# Patient Record
Sex: Male | Born: 1990 | Race: White | Hispanic: No | Marital: Single | Smoking: Current some day smoker
Health system: Southern US, Community
[De-identification: ages and names within clinical notes are randomized; demographics above are authoritative.]

---

## 2014-06-11 ENCOUNTER — Encounter (HOSPITAL_COMMUNITY): Payer: Self-pay | Admitting: Physical Medicine and Rehabilitation

## 2014-06-11 ENCOUNTER — Emergency Department (HOSPITAL_COMMUNITY)
Admission: EM | Admit: 2014-06-11 | Discharge: 2014-06-12 | Disposition: A | Discharge status: Other Acute Inpatient Hospital | Payer: Self-pay | Attending: Emergency Medicine | Admitting: Emergency Medicine

## 2014-06-11 DIAGNOSIS — Z720 Tobacco use: Secondary | ICD-10-CM | POA: Insufficient documentation

## 2014-06-11 DIAGNOSIS — F419 Anxiety disorder, unspecified: Secondary | ICD-10-CM | POA: Insufficient documentation

## 2014-06-11 DIAGNOSIS — F319 Bipolar disorder, unspecified: Secondary | ICD-10-CM | POA: Insufficient documentation

## 2014-06-11 DIAGNOSIS — G479 Sleep disorder, unspecified: Secondary | ICD-10-CM | POA: Insufficient documentation

## 2014-06-11 LAB — CBC
HCT: 46 % (ref 39.0–52.0)
Hemoglobin: 15.9 g/dL (ref 13.0–17.0)
MCH: 29.8 pg (ref 26.0–34.0)
MCHC: 34.6 g/dL (ref 30.0–36.0)
MCV: 86.3 fL (ref 78.0–100.0)
Platelets: 252 10*3/uL (ref 150–400)
RBC: 5.33 MIL/uL (ref 4.22–5.81)
RDW: 12.6 % (ref 11.5–15.5)
WBC: 8.7 10*3/uL (ref 4.0–10.5)

## 2014-06-11 LAB — RAPID URINE DRUG SCREEN, HOSP PERFORMED
Amphetamines: NOT DETECTED
Barbiturates: NOT DETECTED
Benzodiazepines: NOT DETECTED
Cocaine: NOT DETECTED
OPIATES: NOT DETECTED
Tetrahydrocannabinol: POSITIVE — AB

## 2014-06-11 LAB — COMPREHENSIVE METABOLIC PANEL
ALT: 20 U/L (ref 0–53)
ANION GAP: 13 (ref 5–15)
AST: 32 U/L (ref 0–37)
Albumin: 4.8 g/dL (ref 3.5–5.2)
Alkaline Phosphatase: 36 U/L — ABNORMAL LOW (ref 39–117)
BILIRUBIN TOTAL: 1 mg/dL (ref 0.3–1.2)
BUN: 14 mg/dL (ref 6–23)
CO2: 25 mmol/L (ref 19–32)
CREATININE: 1.13 mg/dL (ref 0.50–1.35)
Calcium: 9.2 mg/dL (ref 8.4–10.5)
Chloride: 101 mmol/L (ref 96–112)
GFR calc Af Amer: >90 mL/min (ref >90)
GFR calc non Af Amer: >90 mL/min (ref >90)
Glucose, Bld: 76 mg/dL (ref 70–99)
Potassium: 4 mmol/L (ref 3.5–5.1)
Sodium: 139 mmol/L (ref 135–145)
TOTAL PROTEIN: 7.6 g/dL (ref 6.0–8.3)

## 2014-06-11 LAB — SALICYLATE LEVEL: Salicylate Lvl: <4 mg/dL (ref 2.8–20.0)

## 2014-06-11 LAB — ETHANOL: Alcohol, Ethyl (B): <5 mg/dL (ref 0–9)

## 2014-06-11 LAB — ACETAMINOPHEN LEVEL

## 2014-06-11 MED ORDER — ALUM & MAG HYDROXIDE-SIMETH 200-200-20 MG/5ML PO SUSP: 30.0000 mL | ORAL | Status: DC | PRN

## 2014-06-11 MED ORDER — LORAZEPAM 1 MG PO TABS: 1.0000 mg | ORAL_TABLET | Freq: Three times a day (TID) | ORAL | Status: DC | PRN

## 2014-06-11 MED ORDER — ACETAMINOPHEN 325 MG PO TABS: 650.0000 mg | ORAL_TABLET | ORAL | Status: DC | PRN

## 2014-06-11 NOTE — ED Notes (Signed)
[employer] HERE TO SERVE PAPERS

## 2014-06-11 NOTE — ED Notes (Signed)
FAXED IVC PAPERS TO [facility] 

## 2014-06-11 NOTE — Progress Notes (Addendum)
Patient accepted at [facility], per [employer]. Per RN Drinda ButtsAnnette, patient has been waiting for the Voluntary Consent papers from [place] [place].  Kelvin at [facility] stated that he is now faxing the voluntary consent to [facility] to [employer].  CSW to continue to follow up upon patient's admission to [facility].  Mark [name], LCSWA Disposition staff 06/11/2014 4:06 PM

## 2014-06-11 NOTE — ED Provider Notes (Signed)
CSN: [id number]     Arrival date & time 06/11/14  1018 History   First MD Initiated Contact with Patient 06/11/14 1053     Chief Complaint  Patient presents with  . Psychiatric Evaluation   Mark [name] is a 24 y.o. male with a history of bipolar disorder who presents to the ED with his father who reports he has not been acting himself over the past 4 days. The father believes that he might be having a manic episode. The patient reports he has had lots of thoughts going on in his head. He reports he is a trouble sleeping the past 4 days. He reports he had not slept for 4 days but then slept some last night. He also reports lots of sexual thoughts going thorough his head. He denies suicidal or homicidal ideations. Patient reports he was diagnosed bipolar approximately 1 year ago and was placed on lithium. He reports he has not taken any medications in almost a year however. Patient admits to using marijuana recently but no other illicit substances. The patient endorses thought broadcasting. Patient denies fevers, pain, abdominal pain, headaches, nausea, vomiting, cocaine use, stimulant abuse, alcohol use, SI or HI. He denies visual or auditory hallucinations.  (Consider location/radiation/quality/duration/timing/severity/associated sxs/prior Treatment) HPI  History reviewed. No pertinent past medical history. History reviewed. No pertinent past surgical history. No family history on file. History  Substance Use Topics  . Smoking status: Current Some Day Smoker  . Smokeless tobacco: Not on file  . Alcohol Use: Yes    Review of Systems  Constitutional: Negative for fever and chills.  HENT: Negative for congestion and sore throat.   Eyes: Negative for visual disturbance.  Respiratory: Negative for cough and shortness of breath.   Cardiovascular: Negative for chest pain and palpitations.  Gastrointestinal: Negative for nausea, vomiting and abdominal pain.  Genitourinary: Negative for  dysuria.  Musculoskeletal: Negative for back pain and neck pain.  Skin: Negative for rash.  Neurological: Negative for syncope, light-headedness and headaches.  Psychiatric/Behavioral: Positive for sleep disturbance. Negative for suicidal ideas, hallucinations and self-injury. The patient is nervous/anxious.       Allergies  Review of patient's allergies indicates no known allergies.  Home Medications   Prior to Admission medications   Not on File   BP 133/72 mmHg  Pulse 84  Temp(Src) 98.8 F (37.1 C) (Oral)  Resp 16  SpO2 100% Physical Exam  Constitutional: He is oriented to person, place, and time. He appears well-developed and well-nourished. No distress.  HENT:  Head: Normocephalic and atraumatic.  Mouth/Throat: Oropharynx is clear and moist.  Eyes: Conjunctivae are normal. Pupils are equal, round, and reactive to light. Right eye exhibits no discharge. Left eye exhibits no discharge.  Neck: Neck supple. No JVD present.  Cardiovascular: Normal rate, regular rhythm, normal heart sounds and intact distal pulses.  Exam reveals no gallop and no friction rub.   No murmur heard. Pulmonary/Chest: Effort normal and breath sounds normal. No respiratory distress. He has no wheezes. He has no rales.  Abdominal: Soft. He exhibits no distension. There is no tenderness.  Musculoskeletal: He exhibits no edema.  Lymphadenopathy:    He has no cervical adenopathy.  Neurological: He is alert and oriented to person, place, and time. Coordination normal.  Skin: Skin is warm and dry. No rash noted. He is not diaphoretic. No erythema. No pallor.  Psychiatric: His mood appears anxious. His affect is blunt. His affect is not angry and not labile. His speech is  delayed and tangential. His speech is not rapid and/or pressured and not slurred. He is not hyperactive and not combative. Thought content is not paranoid. He expresses no homicidal and no suicidal ideation.  Patient appears anxious. He is  alert and oriented x3. He denies SI or HI. He is calm and cooperative, but sometimes has difficulty responding to simple questions. He endorses thought broadcasting. He reports racing thoughts and sexual thoughts.  He is inattentive.  Nursing note and vitals reviewed.   ED Course  Procedures (including critical care time) Labs Review Labs Reviewed  COMPREHENSIVE METABOLIC PANEL - Abnormal; Notable for the following:    Alkaline Phosphatase 36 (*)    All other components within normal limits  URINE RAPID DRUG SCREEN (HOSP PERFORMED) - Abnormal; Notable for the following:    Tetrahydrocannabinol POSITIVE (*)    All other components within normal limits  ACETAMINOPHEN LEVEL - Abnormal; Notable for the following:    Acetaminophen (Tylenol), Serum <10.0 (*)    All other components within normal limits  CBC  ETHANOL  SALICYLATE LEVEL    Imaging Review No results found.   EKG Interpretation None      Filed Vitals:   06/11/14 1031 06/11/14 1240  BP: 130/95 133/72  Pulse: 109 84  Temp: 98.8 F (37.1 C)   TempSrc: Oral   Resp: 18 16  SpO2: 96% 100%     MDM   Final diagnoses:  Bipolar affective disorder, most recent episode unspecified type, remission status unspecified   This is a 24 y.o. male with a history of bipolar disorder who presents to the ED with his father who reports he has not been acting himself over the past 4 days. The father believes that he might be having a manic episode. The patient reports he has had lots of thoughts going on in his head. He reports he is a trouble sleeping the past 4 days. He reports he had not slept for 4 days but then slept some last night. He also reports lots of sexual thoughts going thorough his head. He denies suicidal or homicidal ideations. He denies physical complaints currently. He denies use of illicit substances other than marijuana recently. The patient is afebrile and nontoxic appearing on exam. His behavior is bizarre and his  reports trouble with sleeping. He has a history of bipolar disorder. His father is at bedside. The patient is calm and cooperative with interview. His urine rapid drug screen is positive only for THC. His alcohol level is negative. He has a negative acetaminophen and salicylate level. His CBC and CMP are unremarkable. Patient is medically clear for behavioral health placement. Behavioral crisis that he meets inpatient criteria. The patient is [facility]. Will disposition accordingly when bed becomes available.     Everlene FarrierWilliam Brennah Quraishi, PA-C 06/11/14 1626  Jerelyn ScottMartha Linker, MD 06/11/14 [phone]

## 2014-06-11 NOTE — ED Notes (Signed)
Called an left message again to sheriff for transport

## 2014-06-11 NOTE — BH Assessment (Signed)
Writer received consult.  The nurse reports that he will place the Tele Consult machine in the room.

## 2014-06-11 NOTE — Progress Notes (Addendum)
Per Claris CheMargaret at [facility], patient accepted [facility] by Dr. Ardyth HarpsHernandez, to bed 315. Call report at [phone]. Patient can come after 9pm. MC-ED nurse aware.  Melbourne Abtsatia Dulcinea Kinser, LCSWA Disposition staff 06/11/2014 9:14 PM

## 2014-06-11 NOTE — ED Notes (Signed)
Pt presents to department for evaluation of confusion and agitation. Father reports patient isn't acting like himself. States he was once treated for manic episode several years ago. Pt denies pain. He is calm and cooperative at the time.

## 2014-06-11 NOTE — ED Notes (Signed)
Telepsych in process at this time.
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
## 2014-06-11 NOTE — ED Notes (Signed)
Called sheriff and left message for transport after 9pm

## 2014-06-11 NOTE — ED Notes (Signed)
FAXED [facility] TO [facility]

## 2014-06-11 NOTE — Progress Notes (Signed)
This writer received call from Kelvin ([phone]) at [facility].  Kelvin requesting Dr's and the counselor's note, and the labs to be faxed at [phone]. Kelvin was informed that pt's referral was faxed at this time.  Catia Magnolia Mattila, LCSWA Disposition staff 06/11/2014 7:26 PM  

## 2014-06-11 NOTE — BH Assessment (Signed)
Per Brett Albinoori, NP - patient meets criteria for inpatient hospitalization. [facility].   CSW will refer to other facilities.

## 2014-06-12 ENCOUNTER — Inpatient Hospital Stay
Admit: 2014-06-12 | Disposition: A | Discharge status: Home / Self Care | Payer: Self-pay | Attending: Psychiatry | Admitting: Psychiatry

## 2014-06-12 MED ORDER — DIPHENHYDRAMINE HCL 25 MG PO CAPS
25.0000 mg | ORAL_CAPSULE | Freq: Every evening | ORAL | Status: DC | PRN
  Administered 2014-06-12: 25 mg via ORAL
  Filled 2014-06-12: qty 1

## 2014-06-12 NOTE — ED Provider Notes (Signed)
10 AM  [facility] per Dr. Ardyth HarpsHernandez.  Mark MawKristen N Tito Ausmus, DO 06/12/14 1001

## 2014-06-12 NOTE — ED Notes (Signed)
SHERRIFF HAS CALLED BACK AND ADVISES THAT THE TRANSPORT PERSON IS AWARE OF THE PATIENT AND WILL CALL BEFORE ARRIVAL

## 2014-06-12 NOTE — ED Notes (Signed)
CALLED CALVIN AT [facility] AND UPDATED HIM ON TRANSPORT DELAY

## 2014-06-12 NOTE — ED Notes (Signed)
CALLED SHERRIFF AND LEFT MESSAGE ABOUT PT TRANSPORT TO [facility]. STILL NO CALL BACK FROM PREVIOUS MESSAGES LEFT

## 2014-06-12 NOTE — ED Notes (Signed)
sherriff has arrived to transport

## 2014-06-12 NOTE — ED Notes (Signed)
Called sherriff about transport and left message with phone number

## 2014-06-12 NOTE — ED Notes (Signed)
Pt asked for benadryl to help sleep, pt very suspicious and appears to be responding to auditory hallucinations, pt questioned "what chemical balance benadryl was made of and how many neurons. "

## 2014-06-12 NOTE — ED Notes (Signed)
PT FATHER HAS CALLED TO CHECK ON HIM. HE HAS ASKED THAT WE TELL HIM HE WILL SEE HIM AT [facility] AROUND 4 PM. ASKED FATHER TO BRING PT SOME CLOTHES TO WEAR WHEN HE GOES TO VISIT

## 2014-06-13 LAB — LIPID PANEL
Cholesterol: 175 mg/dL
HDL Cholesterol: 44 mg/dL
Ldl Cholesterol, Calc: 120 mg/dL — ABNORMAL HIGH
Triglycerides: 56 mg/dL
VLDL CHOLESTEROL, CALC: 11 mg/dL

## 2014-06-13 LAB — CBC WITH DIFFERENTIAL/PLATELET
BASOS PCT: 0.4 %
Basophil #: 0 10*3/uL (ref 0.0–0.1)
EOS ABS: 0 10*3/uL (ref 0.0–0.7)
Eosinophil %: 0.4 %
HCT: 46.3 % (ref 40.0–52.0)
HGB: 15.5 g/dL (ref 13.0–18.0)
LYMPHS PCT: 22.6 %
Lymphocyte #: 2 10*3/uL (ref 1.0–3.6)
MCH: 29.9 pg (ref 26.0–34.0)
MCHC: 33.6 g/dL (ref 32.0–36.0)
MCV: 89 fL (ref 80–100)
Monocyte #: 0.7 x10 3/mm (ref 0.2–1.0)
Monocyte %: 7.3 %
NEUTROS ABS: 6.2 10*3/uL (ref 1.4–6.5)
Neutrophil %: 69.3 %
Platelet: 278 10*3/uL (ref 150–440)
RBC: 5.21 10*6/uL (ref 4.40–5.90)
RDW: 13.4 % (ref 11.5–14.5)
WBC: 9 10*3/uL (ref 3.8–10.6)

## 2014-06-13 LAB — TSH: THYROID STIMULATING HORM: 0.895 u[IU]/mL

## 2014-06-13 LAB — COMPREHENSIVE METABOLIC PANEL
AST: 27 U/L
Albumin: 5.3 g/dL — ABNORMAL HIGH
Alkaline Phosphatase: 35 U/L — ABNORMAL LOW
Anion Gap: 8 (ref 7–16)
BUN: 15 mg/dL
Bilirubin,Total: 1.3 mg/dL — ABNORMAL HIGH
CALCIUM: 9.9 mg/dL
Chloride: 103 mmol/L
Co2: 29 mmol/L
Creatinine: 0.95 mg/dL
EGFR (Non-African Amer.): >60
Glucose: 82 mg/dL
POTASSIUM: 4 mmol/L
SGPT (ALT): 21 U/L
Sodium: 140 mmol/L
TOTAL PROTEIN: 8.4 g/dL — AB

## 2014-06-13 LAB — HEMOGLOBIN A1C: HEMOGLOBIN A1C: 5.2 %

## 2014-06-20 NOTE — H&P (Signed)
PATIENT NAME:  Mark [name], Mark [name] MR#:  [id number] DATE OF BIRTH:  November 14, 1990  DATE OF ADMISSION:  06/12/2014  IDENTIFYING DATA: The patient is [name] 24 year old male who was IVCd from [facility] in [place].   CHIEF COMPLAINT: The patient relates that his father brought him into the hospital. When asked why, he states "addiction, love, God."   HISTORY OF PRESENT ILLNESS: The patient is not [name] reliable historian. However, the facts that he presents are that he was brought in for "bad habits." When asked to specify, he stated he has been using [name] lot of marijuana. He states he has been using about [name] gram [name] day for the past month. He states that his mood has been "stable." Then he stated that his mood has been "up." He states his sleep has not been good and he has not been sleeping more than 1 to 2 hours [name] day. He denies signs and symptoms of depression. He states that the marijuana has been helping him. He does not really answer questions on topic; however, available collateral information indicates that he was brought in to the emergency room in [place] by his father. His father brought him in for bizarre behavior. The patient informed people in [place] that he will make [name] lot of money and be famous. He alluded to being fixated on [employer] making him famous. He denies any suicidal ideation or homicidal ideation.   PAST PSYCHIATRIC HISTORY: The patient states that he did take lithium for about 3 weeks. He states that he believes he last took this about [name] year ago. He stated it gave him problems with feeling bloated and having regular bowel movements. He denies any other past psychiatric trials. He states that he was hospitalized in what he believed was called [facility] for bipolar. Per collateral information, the patient was hospitalized in [place] for 3 weeks in 2015. He was diagnosed with bipolar disorder and prescribed the lithium as noted above. He denies any past  suicide attempts. When asked about other drugs besides marijuana, he did allude to opiates (Opana, OxyContin, Lortab). He states that he was last using Opana about 1 month ago. He states he also has used Xanax and Adderall.   PAST MEDICAL HISTORY: When asked about any past medical history, the patient did not endorse any past medical problems, but then again was reluctant to answer the question directly.   FAMILY HISTORY OF PSYCHIATRIC ILLNESS: The patient states his father has depression.   SOCIAL HISTORY: The patient was evasive and guarded around these questions. All he would state when I asked where he was living, as he was states he was, "living with God."   REVIEW OF SYSTEMS: The patient denied any current symptoms, however, his answers were off topic. He states that he did have chest pain when he stopped smoking weed. When asked about fever, night sweats or weight loss, he simply stared. When asked about diarrhea or constipation, he simply stared. When asked about pain in any area, he stared at me. His response to 10 review of systems was simply to stare at me.   MENTAL STATUS EXAMINATION: The patient was [name] young male appearing his stated age of 24. He was alert to place and his situation. He was alert to person. Otherwise, not alert to date or specific location. He was guarded. His speech was halted and it was indicative of impoverish thoughts. His thought process was disorganized. At times, it looked like there was thought  blocking. Thought content: He denied any suicidal ideation, denied any homicidal ideation. He denied any auditory hallucinations or visual hallucinations, but it was evident  he was having some auditory hallucinations and potentially visual hallucinations as he would sometimes stare off and utter something that was not in the context of the questions I was asking him. His mood was "up." His affect was bizarre. At times, he would smile and simply stare and at other times, his  affect would become constricted. His insight and judgment are poor. His remote memory did appear to be intact as he was able to discuss past history, his trials of lithium and how he got to the hospital. However, his memory for details and things appear to be affected by his disorganized thought process.   PHYSICAL EXAMINATION: The patient has refused vital signs. His gait was grossly intact. His muscle strength was grossly intact. Given his psychotic state, we are going to defer physical examination for now.   LABORATORY RESULTS: The patient is admitted to this hospital, however, had some labs done at [facility]. The results are not printed out in full; however, they relate that the comprehensive metabolic panel is only notable for elevated alkaline phosphatase. The urine drug screen was positive for marijuana and Tylenol was level was noted to be less than 10. I am not able to see the results of the CBC. Thus, I am going to order laboratory studies.   ASSESSMENT AND PLAN: [name] 24 year old male with [name] past diagnosis of bipolar disorder. Per history and collateral information, it appears he has had mania, but also in the context per his report of heavy cannabis use. He has not been on any medications and this appears that this has been the case for several months if not [name] year. We will try to get him to agree to take some Zyprexa Zydis. We will follow him. I am going to go ahead and order laboratory studies as his information from [facility] indicates some labs were done, but all I see is [name] comprehensive metabolic panel and [name] urine drug screen.     ____________________________ Loralie Champagne Mayford Knife, MD alw:TT D: 06/12/2014 13:50:41 ET T: 06/12/2014 15:01:41 ET JOB#: [id number]  cc: Tamma Brigandi L. Mayford Knife, MD, <Dictator> Kerin Salen MD ELECTRONICALLY SIGNED 06/16/2014 13:03

## 2016-05-11 IMAGING — CR DG CHEST 1V
1 series · 1 of 1 positions shown · non-contrast
Comparison: None.

CLINICAL DATA: TB clearance

EXAM:
CHEST  1 VIEW

[dxr chest 1 viewap or pa]
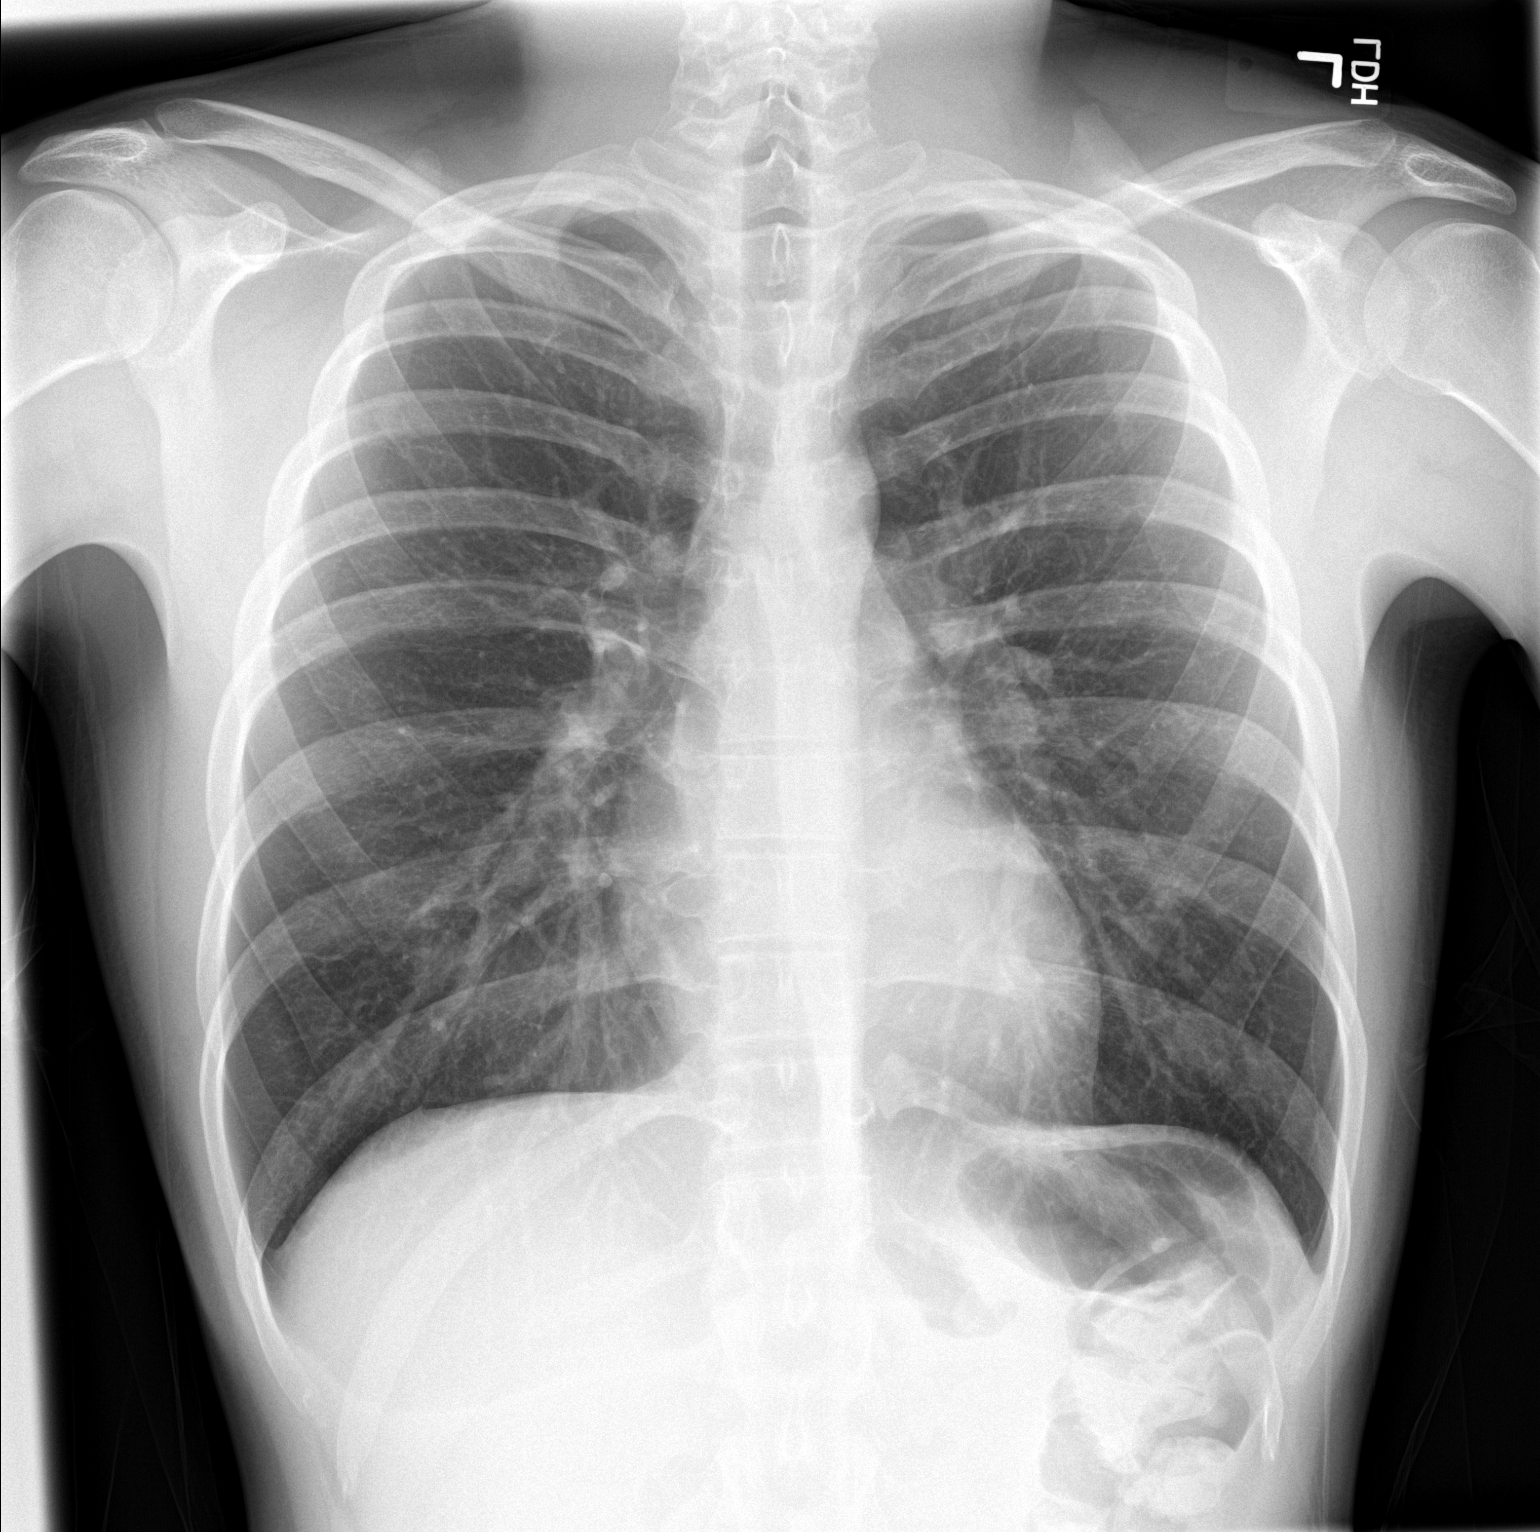

[1 of 1 positions shown; findings below may reference images not displayed]

FINDINGS: Normal heart size. Clear lungs. No pleural effusion. No cavitary
lung lesion.
IMPRESSION: No active disease.

## 2022-11-01 ENCOUNTER — Other Ambulatory Visit (HOSPITAL_COMMUNITY): Payer: Self-pay
